# Patient Record
Sex: Female | Born: 1965 | Race: Black or African American | Hispanic: No | Marital: Married | Smoking: Never smoker
Health system: Southern US, Community
[De-identification: ages and names within clinical notes are randomized; demographics above are authoritative.]

## PROBLEM LIST (undated history)

## (undated) DIAGNOSIS — F419 Anxiety disorder, unspecified: Secondary | ICD-10-CM

## (undated) DIAGNOSIS — I10 Essential (primary) hypertension: Secondary | ICD-10-CM

## (undated) DIAGNOSIS — E119 Type 2 diabetes mellitus without complications: Secondary | ICD-10-CM

## (undated) DIAGNOSIS — E785 Hyperlipidemia, unspecified: Secondary | ICD-10-CM

## (undated) DIAGNOSIS — G35 Multiple sclerosis: Secondary | ICD-10-CM

---

## 2016-09-13 ENCOUNTER — Observation Stay (HOSPITAL_COMMUNITY)
Admission: EM | Admit: 2016-09-13 | Discharge: 2016-09-14 | Disposition: A | Discharge status: Home / Self Care | Payer: Medicare HMO | Attending: Infectious Diseases | Admitting: Infectious Diseases

## 2016-09-13 ENCOUNTER — Encounter (HOSPITAL_COMMUNITY): Payer: Self-pay | Admitting: Radiology

## 2016-09-13 ENCOUNTER — Emergency Department (HOSPITAL_COMMUNITY): Payer: Medicare HMO

## 2016-09-13 DIAGNOSIS — R739 Hyperglycemia, unspecified: Secondary | ICD-10-CM

## 2016-09-13 DIAGNOSIS — E785 Hyperlipidemia, unspecified: Secondary | ICD-10-CM | POA: Insufficient documentation

## 2016-09-13 DIAGNOSIS — G43809 Other migraine, not intractable, without status migrainosus: Secondary | ICD-10-CM | POA: Diagnosis not present

## 2016-09-13 DIAGNOSIS — Z823 Family history of stroke: Secondary | ICD-10-CM | POA: Diagnosis not present

## 2016-09-13 DIAGNOSIS — Z8249 Family history of ischemic heart disease and other diseases of the circulatory system: Secondary | ICD-10-CM | POA: Diagnosis not present

## 2016-09-13 DIAGNOSIS — F419 Anxiety disorder, unspecified: Secondary | ICD-10-CM

## 2016-09-13 DIAGNOSIS — E119 Type 2 diabetes mellitus without complications: Secondary | ICD-10-CM | POA: Insufficient documentation

## 2016-09-13 DIAGNOSIS — Z6835 Body mass index (BMI) 35.0-35.9, adult: Secondary | ICD-10-CM | POA: Insufficient documentation

## 2016-09-13 DIAGNOSIS — I639 Cerebral infarction, unspecified: Secondary | ICD-10-CM | POA: Diagnosis present

## 2016-09-13 DIAGNOSIS — R202 Paresthesia of skin: Secondary | ICD-10-CM

## 2016-09-13 DIAGNOSIS — G459 Transient cerebral ischemic attack, unspecified: Secondary | ICD-10-CM | POA: Diagnosis not present

## 2016-09-13 DIAGNOSIS — R200 Anesthesia of skin: Secondary | ICD-10-CM

## 2016-09-13 DIAGNOSIS — Z794 Long term (current) use of insulin: Secondary | ICD-10-CM | POA: Insufficient documentation

## 2016-09-13 DIAGNOSIS — Z888 Allergy status to other drugs, medicaments and biological substances status: Secondary | ICD-10-CM | POA: Insufficient documentation

## 2016-09-13 DIAGNOSIS — Z7982 Long term (current) use of aspirin: Secondary | ICD-10-CM | POA: Insufficient documentation

## 2016-09-13 DIAGNOSIS — Z833 Family history of diabetes mellitus: Secondary | ICD-10-CM

## 2016-09-13 DIAGNOSIS — I6389 Other cerebral infarction: Secondary | ICD-10-CM

## 2016-09-13 DIAGNOSIS — G43909 Migraine, unspecified, not intractable, without status migrainosus: Secondary | ICD-10-CM | POA: Insufficient documentation

## 2016-09-13 DIAGNOSIS — I10 Essential (primary) hypertension: Secondary | ICD-10-CM | POA: Diagnosis not present

## 2016-09-13 DIAGNOSIS — E1165 Type 2 diabetes mellitus with hyperglycemia: Secondary | ICD-10-CM | POA: Diagnosis not present

## 2016-09-13 DIAGNOSIS — G08 Intracranial and intraspinal phlebitis and thrombophlebitis: Secondary | ICD-10-CM

## 2016-09-13 DIAGNOSIS — R51 Headache: Secondary | ICD-10-CM

## 2016-09-13 DIAGNOSIS — R519 Headache, unspecified: Secondary | ICD-10-CM

## 2016-09-13 DIAGNOSIS — G35 Multiple sclerosis: Secondary | ICD-10-CM | POA: Insufficient documentation

## 2016-09-13 DIAGNOSIS — I471 Supraventricular tachycardia: Secondary | ICD-10-CM | POA: Diagnosis not present

## 2016-09-13 DIAGNOSIS — Z79899 Other long term (current) drug therapy: Secondary | ICD-10-CM

## 2016-09-13 DIAGNOSIS — Z8669 Personal history of other diseases of the nervous system and sense organs: Secondary | ICD-10-CM

## 2016-09-13 DIAGNOSIS — E669 Obesity, unspecified: Secondary | ICD-10-CM | POA: Diagnosis not present

## 2016-09-13 HISTORY — DX: Anxiety disorder, unspecified: F41.9

## 2016-09-13 HISTORY — DX: Essential (primary) hypertension: I10

## 2016-09-13 HISTORY — DX: Multiple sclerosis: G35

## 2016-09-13 HISTORY — DX: Hyperlipidemia, unspecified: E78.5

## 2016-09-13 HISTORY — DX: Type 2 diabetes mellitus without complications: E11.9

## 2016-09-13 LAB — PROTIME-INR
INR: 0.99
PROTHROMBIN TIME: 13.1 s (ref 11.4–15.2)

## 2016-09-13 LAB — COMPREHENSIVE METABOLIC PANEL
ALBUMIN: 3.4 g/dL — AB (ref 3.5–5.0)
ALT: 20 U/L (ref 14–54)
ANION GAP: 11 (ref 5–15)
AST: 17 U/L (ref 15–41)
Alkaline Phosphatase: 149 U/L — ABNORMAL HIGH (ref 38–126)
BUN: 5 mg/dL — AB (ref 6–20)
CHLORIDE: 103 mmol/L (ref 101–111)
CO2: 25 mmol/L (ref 22–32)
Calcium: 9.8 mg/dL (ref 8.9–10.3)
Creatinine, Ser: 0.66 mg/dL (ref 0.44–1.00)
GFR calc Af Amer: >60 mL/min (ref >60)
GFR calc non Af Amer: >60 mL/min (ref >60)
GLUCOSE: 339 mg/dL — AB (ref 65–99)
POTASSIUM: 3.5 mmol/L (ref 3.5–5.1)
SODIUM: 139 mmol/L (ref 135–145)
TOTAL PROTEIN: 6.5 g/dL (ref 6.5–8.1)
Total Bilirubin: 0.8 mg/dL (ref 0.3–1.2)

## 2016-09-13 LAB — I-STAT CHEM 8, ED
BUN: 5 mg/dL — AB (ref 6–20)
CREATININE: 0.6 mg/dL (ref 0.44–1.00)
Calcium, Ion: 1.21 mmol/L (ref 1.15–1.40)
Chloride: 101 mmol/L (ref 101–111)
Glucose, Bld: 327 mg/dL — ABNORMAL HIGH (ref 65–99)
HEMATOCRIT: 41 % (ref 36.0–46.0)
HEMOGLOBIN: 13.9 g/dL (ref 12.0–15.0)
POTASSIUM: 3.5 mmol/L (ref 3.5–5.1)
SODIUM: 140 mmol/L (ref 135–145)
TCO2: 27 mmol/L (ref 0–100)

## 2016-09-13 LAB — DIFFERENTIAL
BASOS ABS: 0 10*3/uL (ref 0.0–0.1)
BASOS PCT: 0 %
EOS ABS: 0.1 10*3/uL (ref 0.0–0.7)
EOS PCT: 1 %
LYMPHS ABS: 3.3 10*3/uL (ref 0.7–4.0)
Lymphocytes Relative: 28 %
Monocytes Absolute: 0.6 10*3/uL (ref 0.1–1.0)
Monocytes Relative: 5 %
NEUTROS PCT: 66 %
Neutro Abs: 7.6 10*3/uL (ref 1.7–7.7)

## 2016-09-13 LAB — CBC
HCT: 39.8 % (ref 36.0–46.0)
HEMOGLOBIN: 13.4 g/dL (ref 12.0–15.0)
MCH: 27.9 pg (ref 26.0–34.0)
MCHC: 33.7 g/dL (ref 30.0–36.0)
MCV: 82.7 fL (ref 78.0–100.0)
PLATELETS: 211 10*3/uL (ref 150–400)
RBC: 4.81 MIL/uL (ref 3.87–5.11)
RDW: 12.2 % (ref 11.5–15.5)
WBC: 11.6 10*3/uL — ABNORMAL HIGH (ref 4.0–10.5)

## 2016-09-13 LAB — I-STAT TROPONIN, ED: Troponin i, poc: 0.01 ng/mL (ref 0.00–0.08)

## 2016-09-13 LAB — APTT: APTT: 27 s (ref 24–36)

## 2016-09-13 MED ORDER — ONDANSETRON HCL 4 MG/2ML IJ SOLN
4.0000 mg | Freq: Once | INTRAMUSCULAR | Status: AC
  Administered 2016-09-13: 4 mg via INTRAVENOUS
  Filled 2016-09-13: qty 2

## 2016-09-13 MED ORDER — METOCLOPRAMIDE HCL 5 MG/ML IJ SOLN
10.0000 mg | Freq: Once | INTRAMUSCULAR | Status: AC
  Administered 2016-09-13: 10 mg via INTRAVENOUS
  Filled 2016-09-13: qty 2

## 2016-09-13 MED ORDER — HYDROMORPHONE HCL 2 MG/ML IJ SOLN
2.0000 mg | Freq: Once | INTRAMUSCULAR | Status: AC
  Administered 2016-09-13: 2 mg via INTRAVENOUS
  Filled 2016-09-13: qty 1

## 2016-09-13 MED ORDER — ATORVASTATIN CALCIUM 80 MG PO TABS
80.0000 mg | ORAL_TABLET | Freq: Every day | ORAL | Status: DC
  Administered 2016-09-14 (×2): 80 mg via ORAL
  Filled 2016-09-13 (×2): qty 1

## 2016-09-13 MED ORDER — MORPHINE SULFATE (PF) 4 MG/ML IV SOLN
2.0000 mg | Freq: Once | INTRAVENOUS | Status: AC
  Administered 2016-09-13: 2 mg via INTRAVENOUS
  Filled 2016-09-13: qty 1
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
## 2016-09-13 NOTE — ED Notes (Signed)
Patient transported to MRI 
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
## 2016-09-13 NOTE — ED Provider Notes (Addendum)
[facility]-EMERGENCY DEPT Provider Note   CSN: [id number] Arrival date & time: 09/13/16  1936     History   Chief Complaint Chief Complaint  Patient presents with  . Code Stroke    HPI Alisha Morton is a 50 y.o. female.  Patient c/o right facial, and ?RUE numbness, onset this evening. States vision also seemed diffusely blurry. No visual field cut or amaurosis. No eye pain. Symptoms persistent since onset at approximately 630 PM tonight. Patient with hx MS, denies any recent new symptoms or exacerbation. Patient also notes dull headache, frontal/diffuse. No neck stiffness or rigidity. Denies hx migraines. Patient denies any change in speech. No weakness or loss of normal functional ability.    The history is provided by the patient and the EMS personnel.    Past Medical History:  Diagnosis Date  . Diabetes mellitus without complication (HCC)   . Hypertension   . Multiple sclerosis (HCC)     There are no active problems to display for this patient.   No past surgical history on file.  OB History    No data available       Home Medications    Prior to Admission medications   Not on File    Family History No family history on file.  Social History Social History  Substance Use Topics  . Smoking status: Not on file  . Smokeless tobacco: Not on file  . Alcohol use Not on file     Allergies   Ambien [zolpidem]; Zinc; and Zoloft [sertraline hcl]   Review of Systems Review of Systems  Constitutional: Negative for fever.  HENT: Negative for sinus pain and sore throat.   Eyes: Negative for pain and redness.  Respiratory: Negative for shortness of breath.   Cardiovascular: Negative for chest pain.  Gastrointestinal: Negative for abdominal pain, diarrhea and vomiting.  Genitourinary: Negative for flank pain.  Musculoskeletal: Negative for back pain and neck pain.  Skin: Negative for rash.  Neurological: Negative for headaches.  Hematological: Does not  bruise/bleed easily.  Psychiatric/Behavioral: Negative for confusion.     Physical Exam Updated Vital Signs BP 179/97 (BP Location: Right Arm)   Pulse 91   Temp 98.1 F (36.7 C) (Oral)   Resp 24   Ht 5\' 8"  (1.727 m)   Wt 105.2 kg   SpO2 100%   BMI 35.28 kg/m   Physical Exam  Constitutional: She appears well-developed and well-nourished. No distress.  HENT:  Head: Atraumatic.  Eyes: Conjunctivae are normal. No scleral icterus.  Neck: Neck supple. No tracheal deviation present.  No bruits  Cardiovascular: Normal rate, regular rhythm, normal heart sounds and intact distal pulses.   Pulmonary/Chest: Effort normal and breath sounds normal. No respiratory distress.  Abdominal: Normal appearance. She exhibits no distension. There is no tenderness.  Musculoskeletal: She exhibits no edema.  Neurological: She is alert. No cranial nerve deficit.  Motor intact bil, stre 5/5. sens grossly intact  Skin: Skin is warm and dry. No rash noted.  Psychiatric: She has a normal mood and affect.  Nursing note and vitals reviewed.    ED Treatments / Results  Labs (all labs ordered are listed, but only abnormal results are displayed) Results for orders placed or performed during the hospital encounter of 09/13/16  Protime-INR  Result Value Ref Range   Prothrombin Time 13.1 11.4 - 15.2 seconds   INR 0.99   APTT  Result Value Ref Range   aPTT 27 24 - 36 seconds  CBC  Result Value Ref Range   WBC 11.6 (H) 4.0 - 10.5 K/uL   RBC 4.81 3.87 - 5.11 MIL/uL   Hemoglobin 13.4 12.0 - 15.0 g/dL   HCT 16.139.8 09.636.0 - 04.546.0 %   MCV 82.7 78.0 - 100.0 fL   MCH 27.9 26.0 - 34.0 pg   MCHC 33.7 30.0 - 36.0 g/dL   RDW 40.912.2 81.111.5 - 91.415.5 %   Platelets 211 150 - 400 K/uL  Differential  Result Value Ref Range   Neutrophils Relative % 66 %   Neutro Abs 7.6 1.7 - 7.7 K/uL   Lymphocytes Relative 28 %   Lymphs Abs 3.3 0.7 - 4.0 K/uL   Monocytes Relative 5 %   Monocytes Absolute 0.6 0.1 - 1.0 K/uL    Eosinophils Relative 1 %   Eosinophils Absolute 0.1 0.0 - 0.7 K/uL   Basophils Relative 0 %   Basophils Absolute 0.0 0.0 - 0.1 K/uL  Comprehensive metabolic panel  Result Value Ref Range   Sodium 139 135 - 145 mmol/L   Potassium 3.5 3.5 - 5.1 mmol/L   Chloride 103 101 - 111 mmol/L   CO2 25 22 - 32 mmol/L   Glucose, Bld 339 (H) 65 - 99 mg/dL   BUN 5 (L) 6 - 20 mg/dL   Creatinine, Ser 7.820.66 0.44 - 1.00 mg/dL   Calcium 9.8 8.9 - 95.610.3 mg/dL   Total Protein 6.5 6.5 - 8.1 g/dL   Albumin 3.4 (L) 3.5 - 5.0 g/dL   AST 17 15 - 41 U/L   ALT 20 14 - 54 U/L   Alkaline Phosphatase 149 (H) 38 - 126 U/L   Total Bilirubin 0.8 0.3 - 1.2 mg/dL   GFR calc non Af Amer >60 >60 mL/min   GFR calc Af Amer >60 >60 mL/min   Anion gap 11 5 - 15  I-stat troponin, ED  Result Value Ref Range   Troponin i, poc 0.01 0.00 - 0.08 ng/mL   Comment 3          I-Stat Chem 8, ED  Result Value Ref Range   Sodium 140 135 - 145 mmol/L   Potassium 3.5 3.5 - 5.1 mmol/L   Chloride 101 101 - 111 mmol/L   BUN 5 (L) 6 - 20 mg/dL   Creatinine, Ser 2.130.60 0.44 - 1.00 mg/dL   Glucose, Bld 086327 (H) 65 - 99 mg/dL   Calcium, Ion 5.781.21 4.691.15 - 1.40 mmol/L   TCO2 27 0 - 100 mmol/L   Hemoglobin 13.9 12.0 - 15.0 g/dL   HCT 62.941.0 52.836.0 - 41.346.0 %   Mr Brain Wo Contrast  Result Date: 09/13/2016 CLINICAL DATA:  Stroke symptoms. Photophobia right-sided numbness. Blurred vision. History of MS. EXAM: MRI HEAD WITHOUT CONTRAST TECHNIQUE: Multiplanar, multiecho pulse sequences of the brain and surrounding structures were obtained without intravenous contrast. COMPARISON:  Head CT from earlier tonight FINDINGS: Brain: No acute infarction, hemorrhage, hydrocephalus, extra-axial collection or mass lesion. Moderate for age patchy FLAIR hyperintensity in the cerebral white matter, with thin confluent rim around the lateral ventricles and patchy in the deep and juxta cortical white matter. No infratentorial involvement is noted. No black holes or brain  atrophy. No restricted diffusion to suggest active demyelination. Expanded sella which is predominately CSF intensity, with internal flow void. Findings consistent with partially empty sella. Vascular: Loss of normal flow void within the right vein of Labbe. Normal arterial flow voids. Normal dural venous sinus appearance. Skull and upper cervical spine: Negative Sinuses/Orbits:  Negative IMPRESSION: 1. Abnormal signal in the right vein of Labbe, recommend enhanced MRV or CTV. 2. No acute infarct. 3. Moderate white matter disease with nonspecific pattern in this patient with history of multiple sclerosis and vascular risk factors. Electronically Signed   By: Marnee Spring M.D.   On: 09/13/2016 21:08   Ct Head Code Stroke W/o Cm  Result Date: 09/13/2016 CLINICAL DATA:  Code stroke. Sudden onset right-sided headache. Right facial numbness and blurry vision. EXAM: CT HEAD WITHOUT CONTRAST TECHNIQUE: Contiguous axial images were obtained from the base of the skull through the vertex without intravenous contrast. COMPARISON:  None. FINDINGS: Brain: No evidence of acute infarction, hemorrhage, hydrocephalus, extra-axial collection or mass lesion/mass effect. Mild moderate for age patchy low-density in the cerebral white matter, nonspecific but usually from chronic microvascular disease if vascular risk factors. Demyelinating process is also considered given patient's age and history of blurry vision. Vascular: No hyperdense vessel. Skull: Expanded low-density sella consistent with partially empty sella. There is no upward displacement of the chiasm to suggest an arachnoid cyst. Sinuses/Orbits: Negative Other: These results were called by telephone at the time of interpretation on 09/13/2016 at 7:52 pm to Dr. Roseanne Reno, who verbally acknowledged these results. ASPECTS [facility] Stroke Program Early CT Score) - Ganglionic level infarction (caudate, lentiform nuclei, internal capsule, insula, M1-M3 cortex): 7 -  Supraganglionic infarction (M4-M6 cortex): 3 Total score (0-10 with 10 being normal): 10 IMPRESSION: 1. No acute finding.ASPECTS is 10. 2. Nonspecific white matter disease as described above. Electronically Signed   By: Marnee Spring M.D.   On: 09/13/2016 19:57    EKG  EKG Interpretation  Date/Time:  Sunday September 13 2016 19:59:53 EST Ventricular Rate:  95 PR Interval:    QRS Duration: 87 QT Interval:  330 QTC Calculation: 415 R Axis:   65 Text Interpretation:  Sinus rhythm Nonspecific T wave abnormality No previous tracing Confirmed by Denton Lank  MD, Caryn Bee ([id number]) on 09/13/2016 8:10:25 PM       Radiology Ct Head Code Stroke W/o Cm  Result Date: 09/13/2016 CLINICAL DATA:  Code stroke. Sudden onset right-sided headache. Right facial numbness and blurry vision. EXAM: CT HEAD WITHOUT CONTRAST TECHNIQUE: Contiguous axial images were obtained from the base of the skull through the vertex without intravenous contrast. COMPARISON:  None. FINDINGS: Brain: No evidence of acute infarction, hemorrhage, hydrocephalus, extra-axial collection or mass lesion/mass effect. Mild moderate for age patchy low-density in the cerebral white matter, nonspecific but usually from chronic microvascular disease if vascular risk factors. Demyelinating process is also considered given patient's age and history of blurry vision. Vascular: No hyperdense vessel. Skull: Expanded low-density sella consistent with partially empty sella. There is no upward displacement of the chiasm to suggest an arachnoid cyst. Sinuses/Orbits: Negative Other: These results were called by telephone at the time of interpretation on 09/13/2016 at 7:52 pm to Dr. Roseanne Reno, who verbally acknowledged these results. ASPECTS [facility] Stroke Program Early CT Score) - Ganglionic level infarction (caudate, lentiform nuclei, internal capsule, insula, M1-M3 cortex): 7 - Supraganglionic infarction (M4-M6 cortex): 3 Total score (0-10 with 10 being normal): 10  IMPRESSION: 1. No acute finding.ASPECTS is 10. 2. Nonspecific white matter disease as described above. Electronically Signed   By: Marnee Spring M.D.   On: 09/13/2016 19:57    Procedures Procedures (including critical care time)  Medications Ordered in ED Medications  HYDROmorphone (DILAUDID) injection 2 mg (2 mg Intravenous Given 09/13/16 2008)  ondansetron (ZOFRAN) injection 4 mg (4 mg Intravenous Given 09/13/16  2008)     Initial Impression / Assessment and Plan / ED Course  I have reviewed the triage vital signs and the nursing notes.  Pertinent labs & imaging results that were available during my care of the patient were reviewed by me and considered in my medical decision making (see chart for details).  Clinical Course     Iv ns. Labs. Continuous pulse ox and monitor.  Neurology, Dr Roseanne Reno, evaluated in ED and recommends admit to medical service and mri.  Recheck pt, numbness improving but not completely resolved.  reglan for nausea/headache.   Medical service consulted for admission.     Final Clinical Impressions(s) / ED Diagnoses   Final diagnoses:  None    New Prescriptions New Prescriptions   No medications on file     Cathren Laine, MD 09/13/16 2131    Cathren Laine, MD 10/08/16 [phone]

## 2016-09-13 NOTE — ED Notes (Signed)
Pt returned from MRI and placed back on monitor.  

## 2016-09-13 NOTE — ED Notes (Signed)
Pt remains in MRI 

## 2016-09-13 NOTE — Consult Note (Signed)
Admission H&P    Chief Complaint: Acute onset of headache and right-sided numbness.  HPI: Alisha Morton is an 50 y.o. female history diabetes mellitus, hypertension and multiple sclerosis, as well as SVT, brought to the ED in code stroke status following acute onset of severe right posterior headache and numbness involving right side of her face arm and leg at 6:40 PM while driving. She has no previous history of headache of this nature and no history of stroke nor TIA. Speech remained unchanged. CT scan of her head showed nonspecific white matter changes but no acute intracranial abnormality. Numbness on the right side has persisted. NIH stroke score was 1. MRI of the brain is pending.  LSN: 6:40 PM on 09/13/2016 tPA Given: No: Minimal deficits mRankin:  Past Medical History:  Diagnosis Date  . Diabetes mellitus without complication (HCC)   . Hypertension   . Multiple sclerosis (HCC)     No past surgical history on file.  No family history on file. Social History:  has no tobacco, alcohol, and drug history on file.  Allergies:  Allergies  Allergen Reactions  . Ambien [Zolpidem]     Confusion   . Zinc     Headache   . Zoloft [Sertraline Hcl]     Confusion     Medications: Outpatient medications were reviewed by me.  ROS: History obtained from the patient  General ROS: negative for - chills, fatigue, fever, night sweats, weight gain or weight loss Psychological ROS: negative for - behavioral disorder, hallucinations, memory difficulties, mood swings or suicidal ideation Ophthalmic ROS: negative for - blurry vision, double vision, eye pain or loss of vision ENT ROS: negative for - epistaxis, nasal discharge, oral lesions, sore throat, tinnitus or vertigo Allergy and Immunology ROS: negative for - hives or itchy/watery eyes Hematological and Lymphatic ROS: negative for - bleeding problems, bruising or swollen lymph nodes Endocrine ROS: negative for - galactorrhea, hair  pattern changes, polydipsia/polyuria or temperature intolerance Respiratory ROS: negative for - cough, hemoptysis, shortness of breath or wheezing Cardiovascular ROS: negative for - chest pain, dyspnea on exertion, edema or irregular heartbeat Gastrointestinal ROS: negative for - abdominal pain, diarrhea, hematemesis, nausea/vomiting or stool incontinence Genito-Urinary ROS: negative for - dysuria, hematuria, incontinence or urinary frequency/urgency Musculoskeletal ROS: negative for - joint swelling or muscular weakness Neurological ROS: as noted in HPI Dermatological ROS: negative for rash and skin lesion changes  Physical Examination: Blood pressure 179/97, pulse 91, temperature 98.1 F (36.7 C), temperature source Oral, resp. rate 24, height 5\' 8"  (1.727 m), weight 107.3 kg (236 lb 8.9 oz), SpO2 100 %.  HEENT-  Normocephalic, no lesions, without obvious abnormality.  Normal external eye and conjunctiva.  Normal TM's bilaterally.  Normal auditory canals and external ears. Normal external nose, mucus membranes and septum.  Normal pharynx. Neck supple with no masses, nodes, nodules or enlargement. Cardiovascular - regular rate and rhythm and systolic murmur: systolic ejection 2/6, crescendo at 2nd right intercostal space Lungs - chest clear, no wheezing, rales, normal symmetric air entry Abdomen - soft, non-tender; bowel sounds normal; no masses,  no organomegaly Extremities - no joint deformities, effusion, or inflammation and no edema  Neurologic Examination: Mental Status: Alert, oriented, thought content appropriate, complaining of moderately severe headache.  Speech fluent without evidence of aphasia. Able to follow commands without difficulty. Cranial Nerves: II-Visual fields were normal. III/IV/VI-Pupils were equal and reacted normally to light. Extraocular movements were full and conjugate.    V/VII-no facial numbness and no facial  weakness. VIII-normal. X-normal speech. XI:  trapezius strength/neck flexion strength normal bilaterally XII-midline tongue extension with normal strength. Motor: 5/5 bilaterally with normal tone and bulk Sensory: Normal throughout. Deep Tendon Reflexes: Trace to 1+ and symmetric in upper extremities and absent in lower extremities. Plantars: Flexor bilaterally Cerebellar: Normal finger-to-nose testing. Carotid auscultation: Normal  Results for orders placed or performed during the hospital encounter of 09/13/16 (from the past 48 hour(s))  CBC     Status: Abnormal   Collection Time: 09/13/16  7:45 PM  Result Value Ref Range   WBC 11.6 (H) 4.0 - 10.5 K/uL   RBC 4.81 3.87 - 5.11 MIL/uL   Hemoglobin 13.4 12.0 - 15.0 g/dL   HCT 16.1 09.6 - 04.5 %   MCV 82.7 78.0 - 100.0 fL   MCH 27.9 26.0 - 34.0 pg   MCHC 33.7 30.0 - 36.0 g/dL   RDW 40.9 81.1 - 91.4 %   Platelets 211 150 - 400 K/uL  Differential     Status: None   Collection Time: 09/13/16  7:45 PM  Result Value Ref Range   Neutrophils Relative % 66 %   Neutro Abs 7.6 1.7 - 7.7 K/uL   Lymphocytes Relative 28 %   Lymphs Abs 3.3 0.7 - 4.0 K/uL   Monocytes Relative 5 %   Monocytes Absolute 0.6 0.1 - 1.0 K/uL   Eosinophils Relative 1 %   Eosinophils Absolute 0.1 0.0 - 0.7 K/uL   Basophils Relative 0 %   Basophils Absolute 0.0 0.0 - 0.1 K/uL  I-stat troponin, ED     Status: None   Collection Time: 09/13/16  7:47 PM  Result Value Ref Range   Troponin i, poc 0.01 0.00 - 0.08 ng/mL   Comment 3            Comment: Due to the release kinetics of cTnI, a negative result within the first hours of the onset of symptoms does not rule out myocardial infarction with certainty. If myocardial infarction is still suspected, repeat the test at appropriate intervals.   I-Stat Chem 8, ED     Status: Abnormal   Collection Time: 09/13/16  7:48 PM  Result Value Ref Range   Sodium 140 135 - 145 mmol/L   Potassium 3.5 3.5 - 5.1 mmol/L   Chloride 101 101 - 111 mmol/L   BUN 5 (L) 6 - 20  mg/dL   Creatinine, Ser 7.82 0.44 - 1.00 mg/dL   Glucose, Bld 956 (H) 65 - 99 mg/dL   Calcium, Ion 2.13 0.86 - 1.40 mmol/L   TCO2 27 0 - 100 mmol/L   Hemoglobin 13.9 12.0 - 15.0 g/dL   HCT 57.8 46.9 - 62.9 %   Ct Head Code Stroke W/o Cm  Result Date: 09/13/2016 CLINICAL DATA:  Code stroke. Sudden onset right-sided headache. Right facial numbness and blurry vision. EXAM: CT HEAD WITHOUT CONTRAST TECHNIQUE: Contiguous axial images were obtained from the base of the skull through the vertex without intravenous contrast. COMPARISON:  None. FINDINGS: Brain: No evidence of acute infarction, hemorrhage, hydrocephalus, extra-axial collection or mass lesion/mass effect. Mild moderate for age patchy low-density in the cerebral white matter, nonspecific but usually from chronic microvascular disease if vascular risk factors. Demyelinating process is also considered given patient's age and history of blurry vision. Vascular: No hyperdense vessel. Skull: Expanded low-density sella consistent with partially empty sella. There is no upward displacement of the chiasm to suggest an arachnoid cyst. Sinuses/Orbits: Negative Other: These results were called by  telephone at the time of interpretation on 09/13/2016 at 7:52 pm to Dr. Roseanne Reno, who verbally acknowledged these results. ASPECTS [facility] Stroke Program Early CT Score) - Ganglionic level infarction (caudate, lentiform nuclei, internal capsule, insula, M1-M3 cortex): 7 - Supraganglionic infarction (M4-M6 cortex): 3 Total score (0-10 with 10 being normal): 10 IMPRESSION: 1. No acute finding.ASPECTS is 10. 2. Nonspecific white matter disease as described above. Electronically Signed   By: Marnee Spring M.D.   On: 09/13/2016 19:57    Assessment: 50 y.o. female with multiple risk factors for stroke presenting with possible TIA or subcortical left small vessel ischemic infarction, in addition to hypertension and moderately severe headache. CT showed no evidence of an  acute hemorrhage.  Stroke Risk Factors - diabetes mellitus, family history and hypertension  Plan: 1. HgbA1c, fasting lipid panel 2. MRA  of the brain without contrast 3. PT consult, OT consult, Speech consult 4. Echocardiogram 5. Carotid dopplers 6. Prophylactic therapy-Antiplatelet med: Aspirin  7. Risk factor modification 8. Telemetry monitoring  C.R. Roseanne Reno, MD [facility] [phone]  09/13/2016, 8:12 PM

## 2016-09-14 ENCOUNTER — Encounter (HOSPITAL_COMMUNITY): Payer: Self-pay | Admitting: General Practice

## 2016-09-14 ENCOUNTER — Observation Stay (HOSPITAL_COMMUNITY): Payer: Medicare HMO

## 2016-09-14 ENCOUNTER — Observation Stay (HOSPITAL_BASED_OUTPATIENT_CLINIC_OR_DEPARTMENT_OTHER): Payer: Medicare HMO

## 2016-09-14 DIAGNOSIS — F419 Anxiety disorder, unspecified: Secondary | ICD-10-CM | POA: Diagnosis not present

## 2016-09-14 DIAGNOSIS — I10 Essential (primary) hypertension: Secondary | ICD-10-CM

## 2016-09-14 DIAGNOSIS — I6789 Other cerebrovascular disease: Secondary | ICD-10-CM | POA: Diagnosis not present

## 2016-09-14 DIAGNOSIS — E118 Type 2 diabetes mellitus with unspecified complications: Secondary | ICD-10-CM

## 2016-09-14 DIAGNOSIS — Z8669 Personal history of other diseases of the nervous system and sense organs: Secondary | ICD-10-CM

## 2016-09-14 DIAGNOSIS — Z794 Long term (current) use of insulin: Secondary | ICD-10-CM

## 2016-09-14 DIAGNOSIS — R739 Hyperglycemia, unspecified: Secondary | ICD-10-CM

## 2016-09-14 DIAGNOSIS — E1165 Type 2 diabetes mellitus with hyperglycemia: Secondary | ICD-10-CM | POA: Diagnosis not present

## 2016-09-14 DIAGNOSIS — R930 Abnormal findings on diagnostic imaging of skull and head, not elsewhere classified: Secondary | ICD-10-CM | POA: Diagnosis not present

## 2016-09-14 DIAGNOSIS — I639 Cerebral infarction, unspecified: Secondary | ICD-10-CM | POA: Diagnosis present

## 2016-09-14 DIAGNOSIS — R51 Headache: Secondary | ICD-10-CM

## 2016-09-14 DIAGNOSIS — R200 Anesthesia of skin: Secondary | ICD-10-CM

## 2016-09-14 DIAGNOSIS — R29818 Other symptoms and signs involving the nervous system: Secondary | ICD-10-CM | POA: Diagnosis not present

## 2016-09-14 DIAGNOSIS — R519 Headache, unspecified: Secondary | ICD-10-CM

## 2016-09-14 DIAGNOSIS — E785 Hyperlipidemia, unspecified: Secondary | ICD-10-CM

## 2016-09-14 LAB — GLUCOSE, CAPILLARY
GLUCOSE-CAPILLARY: 371 mg/dL — AB (ref 65–99)
Glucose-Capillary: 182 mg/dL — ABNORMAL HIGH (ref 65–99)
Glucose-Capillary: 168 mg/dL — ABNORMAL HIGH (ref 65–99)
Glucose-Capillary: 266 mg/dL — ABNORMAL HIGH (ref 65–99)
Glucose-Capillary: 377 mg/dL — ABNORMAL HIGH (ref 65–99)

## 2016-09-14 LAB — CBC
HEMATOCRIT: 37.2 % (ref 36.0–46.0)
Hemoglobin: 12.2 g/dL (ref 12.0–15.0)
MCH: 27.6 pg (ref 26.0–34.0)
MCHC: 32.8 g/dL (ref 30.0–36.0)
MCV: 84.2 fL (ref 78.0–100.0)
Platelets: 214 10*3/uL (ref 150–400)
RBC: 4.42 MIL/uL (ref 3.87–5.11)
RDW: 12.5 % (ref 11.5–15.5)
WBC: 11.8 10*3/uL — AB (ref 4.0–10.5)

## 2016-09-14 LAB — RAPID URINE DRUG SCREEN, HOSP PERFORMED
AMPHETAMINES: NOT DETECTED
Barbiturates: NOT DETECTED
Benzodiazepines: NOT DETECTED
Cocaine: NOT DETECTED
OPIATES: POSITIVE — AB
Tetrahydrocannabinol: NOT DETECTED

## 2016-09-14 LAB — LIPID PANEL
Cholesterol: 241 mg/dL — ABNORMAL HIGH (ref 0–200)
HDL: 40 mg/dL — ABNORMAL LOW (ref >40)
LDL CALC: 156 mg/dL — AB (ref 0–99)
TRIGLYCERIDES: 227 mg/dL — AB (ref <150)
Total CHOL/HDL Ratio: 6 RATIO
VLDL: 45 mg/dL — AB (ref 0–40)

## 2016-09-14 LAB — ETHANOL

## 2016-09-14 LAB — COMPREHENSIVE METABOLIC PANEL
ALT: 18 U/L (ref 14–54)
AST: 18 U/L (ref 15–41)
Albumin: 3.2 g/dL — ABNORMAL LOW (ref 3.5–5.0)
Alkaline Phosphatase: 133 U/L — ABNORMAL HIGH (ref 38–126)
Anion gap: 9 (ref 5–15)
BILIRUBIN TOTAL: 0.8 mg/dL (ref 0.3–1.2)
BUN: 6 mg/dL (ref 6–20)
CO2: 25 mmol/L (ref 22–32)
CREATININE: 0.64 mg/dL (ref 0.44–1.00)
Calcium: 9.5 mg/dL (ref 8.9–10.3)
Chloride: 103 mmol/L (ref 101–111)
GFR calc Af Amer: >60 mL/min (ref >60)
Glucose, Bld: 401 mg/dL — ABNORMAL HIGH (ref 65–99)
Potassium: 3.9 mmol/L (ref 3.5–5.1)
Sodium: 137 mmol/L (ref 135–145)
TOTAL PROTEIN: 6.3 g/dL — AB (ref 6.5–8.1)

## 2016-09-14 LAB — ECHOCARDIOGRAM COMPLETE
HEIGHTINCHES: 68 in
Weight: 3777.6 oz

## 2016-09-14 MED ORDER — SENNOSIDES-DOCUSATE SODIUM 8.6-50 MG PO TABS: 1.0000 | ORAL_TABLET | Freq: Every evening | ORAL | Status: DC | PRN

## 2016-09-14 MED ORDER — AMITRIPTYLINE HCL 25 MG PO TABS
50.0000 mg | ORAL_TABLET | Freq: Every day | ORAL | Status: DC
  Administered 2016-09-14 (×2): 50 mg via ORAL
  Filled 2016-09-14 (×2): qty 2

## 2016-09-14 MED ORDER — ENOXAPARIN SODIUM 40 MG/0.4ML ~~LOC~~ SOLN
40.0000 mg | SUBCUTANEOUS | Status: DC
  Administered 2016-09-14: 40 mg via SUBCUTANEOUS
  Filled 2016-09-14: qty 0.4

## 2016-09-14 MED ORDER — DIPHENHYDRAMINE HCL 50 MG/ML IJ SOLN
12.5000 mg | Freq: Once | INTRAMUSCULAR | Status: AC
  Administered 2016-09-14: 12.5 mg via INTRAVENOUS
  Filled 2016-09-14: qty 1

## 2016-09-14 MED ORDER — LISINOPRIL 5 MG PO TABS
5.0000 mg | ORAL_TABLET | Freq: Every day | ORAL | Status: DC
  Administered 2016-09-14: 5 mg via ORAL
  Filled 2016-09-14: qty 1

## 2016-09-14 MED ORDER — KETOROLAC TROMETHAMINE 30 MG/ML IJ SOLN
30.0000 mg | Freq: Once | INTRAMUSCULAR | Status: AC
  Administered 2016-09-14: 30 mg via INTRAVENOUS
  Filled 2016-09-14: qty 1

## 2016-09-14 MED ORDER — STROKE: EARLY STAGES OF RECOVERY BOOK
Freq: Once | Status: AC
  Administered 2016-09-14: 1

## 2016-09-14 MED ORDER — 3-IN-1 BEDSIDE TOILET MISC: 1.0000 [IU] | Freq: Once | 0 refills | Status: AC

## 2016-09-14 MED ORDER — CITALOPRAM HYDROBROMIDE 10 MG PO TABS
20.0000 mg | ORAL_TABLET | Freq: Every day | ORAL | Status: DC
  Administered 2016-09-14: 20 mg via ORAL
  Filled 2016-09-14: qty 2

## 2016-09-14 MED ORDER — PROCHLORPERAZINE EDISYLATE 5 MG/ML IJ SOLN
10.0000 mg | Freq: Once | INTRAMUSCULAR | Status: AC
  Administered 2016-09-14: 10 mg via INTRAVENOUS
  Filled 2016-09-14: qty 2

## 2016-09-14 MED ORDER — BUTALBITAL-APAP-CAFFEINE 50-325-40 MG PO TABS
1.0000 | ORAL_TABLET | Freq: Three times a day (TID) | ORAL | Status: DC | PRN
  Administered 2016-09-14: 1 via ORAL
  Filled 2016-09-14: qty 1

## 2016-09-14 MED ORDER — SODIUM CHLORIDE 0.9 % IV SOLN
INTRAVENOUS | Status: DC
  Administered 2016-09-14: 1000 mL via INTRAVENOUS

## 2016-09-14 MED ORDER — ASPIRIN 325 MG PO TABS
325.0000 mg | ORAL_TABLET | Freq: Every day | ORAL | Status: DC
  Administered 2016-09-14: 325 mg via ORAL
  Filled 2016-09-14 (×2): qty 1

## 2016-09-14 MED ORDER — ONDANSETRON HCL 4 MG/2ML IJ SOLN
4.0000 mg | Freq: Four times a day (QID) | INTRAMUSCULAR | Status: DC
  Administered 2016-09-14 (×4): 4 mg via INTRAVENOUS
  Filled 2016-09-14 (×4): qty 2

## 2016-09-14 MED ORDER — ASPIRIN 300 MG RE SUPP: 300.0000 mg | Freq: Every day | RECTAL | Status: DC

## 2016-09-14 MED ORDER — MORPHINE SULFATE (PF) 4 MG/ML IV SOLN
2.0000 mg | INTRAVENOUS | Status: DC | PRN
  Administered 2016-09-14: 2 mg via INTRAVENOUS
  Filled 2016-09-14: qty 1

## 2016-09-14 MED ORDER — PANTOPRAZOLE SODIUM 40 MG PO TBEC
40.0000 mg | DELAYED_RELEASE_TABLET | Freq: Every day | ORAL | Status: DC
  Administered 2016-09-14: 40 mg via ORAL
  Filled 2016-09-14: qty 1

## 2016-09-14 MED ORDER — INSULIN ASPART 100 UNIT/ML ~~LOC~~ SOLN
0.0000 [IU] | Freq: Three times a day (TID) | SUBCUTANEOUS | Status: DC
  Administered 2016-09-14: 9 [IU] via SUBCUTANEOUS
  Administered 2016-09-14 (×2): 2 [IU] via SUBCUTANEOUS

## 2016-09-14 MED ORDER — INSULIN DETEMIR 100 UNIT/ML ~~LOC~~ SOLN
20.0000 [IU] | Freq: Every day | SUBCUTANEOUS | Status: DC
  Administered 2016-09-14 (×2): 20 [IU] via SUBCUTANEOUS
  Filled 2016-09-14 (×2): qty 0.2

## 2016-09-14 MED ORDER — GADOBENATE DIMEGLUMINE 529 MG/ML IV SOLN
20.0000 mL | Freq: Once | INTRAVENOUS | Status: AC | PRN
  Administered 2016-09-14: 20 mL via INTRAVENOUS

## 2016-09-14 MED ORDER — IOPAMIDOL (ISOVUE-370) INJECTION 76%
100.0000 mL | Freq: Once | INTRAVENOUS | Status: AC | PRN
  Administered 2016-09-14: 100 mL via INTRAVENOUS

## 2016-09-14 NOTE — Care Management Obs Status (Signed)
MEDICARE OBSERVATION STATUS NOTIFICATION   Patient Details  Name: Alisha Morton MRN: [id number] Date of Birth: 12/27/1965   Medicare Observation Status Notification Given:  Yes    Kermit Balo, RN 09/14/2016, 1:25 PM

## 2016-09-14 NOTE — Progress Notes (Signed)
Inpatient Diabetes Program Recommendations  AACE/ADA: New Consensus Statement on Inpatient Glycemic Control (2015)  Target Ranges:  Prepandial:   less than 140 mg/dL      Peak postprandial:   less than 180 mg/dL (1-2 hours)      Critically ill patients:  140 - 180 mg/dL   Lab Results  Component Value Date   GLUCAP 371 (H) 09/14/2016   Diabetes history: Type 2 diabetes Outpatient Diabetes medications: Victoza 1.8 mg daily, Levemir 30 units daily Current orders for Inpatient glycemic control:  Levemir 20 units daily, Novolog sensitive tid with meals  Inpatient Diabetes Program Recommendations:   A1C pending.  Please increasing Levemir to home dose of 30 units daily.  Also may consider adding Novolog meal coverage 5 units tid with meals.  Thanks, Beryl Meager, RN, BC-ADM Inpatient Diabetes Coordinator Pager [phone] (8a-5p)

## 2016-09-14 NOTE — Evaluation (Signed)
Physical Therapy Evaluation Patient Details Name: Alisha Morton MRN: [id number] DOB: [birth year] Today's Date: 09/14/2016   History of Present Illness  50 y/o [place] F with MHx significant for relapsing remitting multiple sclerosis HTN, DM2, HLD who presents for evaluation of sudden onset headache, blurred vision and right-sided numbness.  MRI brain w/o contrast was ordered which showed abnormal signal in right vein of Labbe and enhanced MRV was recommended but was without acute infarction.      Clinical Impression  Pt admitted with above diagnosis. Pt currently with functional limitations due to the deficits listed below (see PT Problem List). Patient describes a feeling of imbalance/about to fall of the edge of something. She reports double vision when focusing on objects >3-514ft away. Reported decr sense of imbalance when educated on visual compensation techniques. Pt will benefit from skilled PT to increase their independence and safety with mobility to allow discharge to the venue listed below.       Follow Up Recommendations Outpatient PT;Supervision for mobility/OOB    Equipment Recommendations  None recommended by PT    Recommendations for Other Services OT consult     Precautions / Restrictions Precautions Precautions: None      Mobility  Bed Mobility Overal bed mobility: Modified Independent             General bed mobility comments: HOB elevated, no rail  Transfers Overall transfer level: Needs assistance Equipment used: Rolling walker (2 wheeled) Transfers: Sit to/from Stand Sit to Stand: Min guard         General transfer comment: closeguard due to dizziness; no unsteadiness  Ambulation/Gait Ambulation/Gait assistance: Min guard Ambulation Distance (Feet): 30 Feet (with RW; 110 no device) Assistive device: 4-wheeled walker;None Gait Pattern/deviations: Step-through pattern;Decreased stride length   Gait velocity interpretation: Below  normal speed for age/gender General Gait Details: no imbalance; reported objects at end of hall looked double, but closer items did not  Careers information officertairs            Wheelchair Mobility    Modified Rankin (Stroke Patients Only)       Balance Overall balance assessment: Needs assistance Sitting-balance support: Feet supported;No upper extremity supported Sitting balance-Leahy Scale: Fair Sitting balance - Comments: >fair TBA   Standing balance support: No upper extremity supported Standing balance-Leahy Scale: Good                               Pertinent Vitals/Pain Pain Assessment: No/denies pain    Home Living Family/patient expects to be discharged to:: Private residence Living Arrangements: Spouse/significant other;Children Available Help at Discharge: Family;Available 24 hours/day Type of Home: House Home Access: Stairs to enter Entrance Stairs-Rails: Doctor, general practiceight;Left Entrance Stairs-Number of Steps: 3 Home Layout: Two level;Bed/bath upstairs Home Equipment: None      Prior Function Level of Independence: Independent               Hand Dominance   Dominant Hand: Right    Extremity/Trunk Assessment   Upper Extremity Assessment: Defer to OT evaluation           Lower Extremity Assessment: Overall WFL for tasks assessed (light touch, proprioception, and coordination intact)      Cervical / Trunk Assessment: Other exceptions  Communication   Communication: No difficulties  Cognition Arousal/Alertness: Awake/alert Behavior During Therapy: Flat affect Overall Cognitive Status: Impaired/Different from baseline Area of Impairment: Memory     Memory: Decreased short-term memory  General Comments: see SLP eval    General Comments General comments (skin integrity, edema, etc.): Patient reports dizziness feels like she's "about to fall off the side of a cloud." Reports intermittent double vision in immediate environment (can focus and  get object to come together to one). Across the room vision she cannot and it remains "a shadow" She reported closing her eyes makes dizziness go away. Educated to focus on a single object nearby and she reported decreased symptoms.    Exercises     Assessment/Plan    PT Assessment Patient needs continued PT services  PT Problem List Decreased balance;Decreased mobility;Decreased knowledge of use of DME;Obesity          PT Treatment Interventions DME instruction;Gait training;Stair training;Functional mobility training;Therapeutic activities;Balance training;Neuromuscular re-education;Patient/family education    PT Goals (Current goals can be found in the Care Plan section)  Acute Rehab PT Goals Patient Stated Goal: go home soon; prefers OPPT PT Goal Formulation: With patient Time For Goal Achievement: 09/21/16 Potential to Achieve Goals: Good    Frequency Min 3X/week   Barriers to discharge        Co-evaluation               End of Session Equipment Utilized During Treatment: Gait belt Activity Tolerance: Patient tolerated treatment well Patient left: in bed;with call bell/phone within reach;with bed alarm set;with family/visitor present Nurse Communication: Mobility status    Functional Assessment Tool Used: clinical judgement Functional Limitation: Mobility: Walking and moving around Mobility: Walking and Moving Around Current Status (J1914): At least 1 percent but less than 20 percent impaired, limited or restricted Mobility: Walking and Moving Around Goal Status [phone]): At least 1 percent but less than 20 percent impaired, limited or restricted    Time: 6213-0865 PT Time Calculation (min) (ACUTE ONLY): 34 min   Charges:   PT Evaluation $PT Eval Low Complexity: 1 Procedure PT Treatments $Gait Training: 8-22 mins   PT G Codes:   PT G-Codes **NOT FOR INPATIENT CLASS** Functional Assessment Tool Used: clinical judgement Functional Limitation: Mobility:  Walking and moving around Mobility: Walking and Moving Around Current Status (H8469): At least 1 percent but less than 20 percent impaired, limited or restricted Mobility: Walking and Moving Around Goal Status [phone]): At least 1 percent but less than 20 percent impaired, limited or restricted    Samani Deal 09/14/2016, 4:58 PM Pager [phone]

## 2016-09-14 NOTE — Evaluation (Signed)
Speech Language Pathology Evaluation Patient Details Name: Alisha Morton MRN: [id number] DOB: Dec 02, 1965 Today's Date: 09/14/2016 Time: [id number] SLP Time Calculation (min) (ACUTE ONLY): 20 min  Problem List:  Patient Active Problem List   Diagnosis Date Noted  . 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 Diabetes mellitus (HCC) 09/13/2016  . Stroke (HCC) 09/13/2016  . Hypertension 09/13/2016  . History of multiple sclerosis 09/13/2016  . Hyperlipidemia 09/13/2016  . Anxiety 09/13/2016  . SVT (supraventricular tachycardia) (HCC) 09/13/2016  . Cerebral venous thrombosis of cortical vein    Past Medical History:  Past Medical History:  Diagnosis Date  . Anxiety   . Diabetes mellitus without complication (HCC)   . Hyperlipidemia   . Hypertension   . Multiple sclerosis (HCC)    Past Surgical History: History reviewed. No pertinent surgical history. HPI:  50 y/o [place] F with MHx significant for relapsing remitting multiple sclerosis HTN, DM2, HLD who presents for evaluation of sudden onset headache, blurred vision and right-sided numbness.  MRI brain w/o contrast was ordered which showed abnormal signal in right vein of Labbe and enhanced MRV was recommended but was without acute infarction.    Assessment / Plan / Recommendation Clinical Impression  Pt reported memory difficulty at baseline. On the [facility] cognitive assessment she scored 16 out of 30 with impairments in working memory, temporal orientation and divergent naming. SLP provided verbal educatin to use writing as a memory tool and use smartphone to set alerts, apps for increased organization/to-do-lists. Pt stated she would like to have outpatient therapy to facilitate memory.     SLP Assessment       Follow Up Recommendations  Outpatient SLP    Frequency and Duration min 2x/week  2 weeks      SLP Evaluation Cognition  Overall Cognitive Status: Impaired/Different from baseline Arousal/Alertness:  Awake/alert Orientation Level: Oriented to person;Oriented to place;Oriented to situation;Disoriented to time Attention: Sustained Sustained Attention: Appears intact Memory: Impaired Memory Impairment: Storage deficit;Retrieval deficit Awareness: Appears intact Problem Solving: Impaired Problem Solving Impairment: Verbal basic Safety/Judgment: Appears intact       Comprehension  Auditory Comprehension Overall Auditory Comprehension: Appears within functional limits for tasks assessed Visual Recognition/Discrimination Discrimination: Not tested Reading Comprehension Reading Status: Not tested (TBA)    Expression Expression Primary Mode of Expression: Verbal Verbal Expression Overall Verbal Expression: Appears within functional limits for tasks assessed Initiation: No impairment Level of Generative/Spontaneous Verbalization: Conversation Repetition: No impairment Naming: No impairment Pragmatics: No impairment Written Expression Dominant Hand: Right Written Expression: Not tested (TBA)   Oral / Motor  Oral Motor/Sensory Function Overall Oral Motor/Sensory Function: Within functional limits Motor Speech Overall Motor Speech: Appears within functional limits for tasks assessed Respiration: Within functional limits Phonation: Normal Resonance: Within functional limits Articulation: Within functional limitis Intelligibility: Intelligible Motor Planning: Witnin functional limits   GO          Functional Assessment Tool Used: skilled clinical judgement Functional Limitations: Memory Memory Current Status ([id number]([id number]): At least 80 percent but less than 100 percent impaired, limited or restricted Memory Goal Status ([id number]([id number]): At least 60 percent but less than 80 percent impaired, limited or restricted         Alisha MacadamiaLitaker, Shedric Fredericks Morton 09/14/2016, 3:34 PM   Alisha Morton

## 2016-09-14 NOTE — Progress Notes (Signed)
  Echocardiogram 2D Echocardiogram has been performed.  Alisha Morton 09/14/2016, 11:18 AM

## 2016-09-14 NOTE — Discharge Instructions (Signed)
You were admitted to the hospital with concern for a stroke.  Thankfully, we did not find any evidence of a stroke or bleeding in your brain and your symptoms got better.  We are not completely sure what caused your headache and made you feel strange; it could have been a TIA, or mini-stroke, or maybe a migraine headache.  Please follow up with Dr Wendi Maya and continue working to get your diabetes and high blood pressure under control.  If you have sudden, terrible headaches; weakness in one arm or one leg; drooping of one side of your face; or have difficulties speaking, please call 911 and go the the ED as these could be signs of a stroke.   Transient Ischemic Attack A transient ischemic attack (TIA) is a "warning stroke" that causes stroke-like symptoms. Unlike a stroke, a TIA does not cause permanent damage to the brain. The symptoms of a TIA can happen very fast and do not last long. It is important to know the symptoms of a TIA and what to do. This can help prevent a major stroke or death. CAUSES  A TIA is caused by a temporary blockage in an artery in the brain or neck (carotid artery). The blockage does not allow the brain to get the blood supply it needs and can cause different symptoms. The blockage can be caused by either:  A blood clot.  Fatty buildup (plaque) in a neck or brain artery. RISK FACTORS  High blood pressure (hypertension).  High cholesterol.  Diabetes mellitus.  Heart disease.  The buildup of plaque in the blood vessels (peripheral artery disease or atherosclerosis).  The buildup of plaque in the blood vessels that provide blood and oxygen to the brain (carotid artery stenosis).  An abnormal heart rhythm (atrial fibrillation).  Obesity.  Using any tobacco products, including cigarettes, chewing tobacco, or electronic cigarettes.  Taking oral contraceptives, especially in combination with using tobacco.  Physical inactivity.  A diet high in fats, salt  (sodium), and calories.  Excessive alcohol use.  Use of illegal drugs (especially cocaine and methamphetamine).  Being female.  Being African American.  Being over the age of 6 years.  Family history of stroke.  Previous history of blood clots, stroke, TIA, or heart attack.  Sickle cell disease. SIGNS AND SYMPTOMS  TIA symptoms are the same as a stroke but are temporary. These symptoms usually develop suddenly, or may be newly present upon waking from sleep:  Sudden weakness or numbness of the face, arm, or leg, especially on one side of the body.  Sudden trouble walking or difficulty moving arms or legs.  Sudden confusion.  Sudden personality changes.  Trouble speaking (aphasia) or understanding.  Difficulty swallowing.  Sudden trouble seeing in one or both eyes.  Double vision.  Dizziness.  Loss of balance or coordination.  Sudden severe headache with no known cause.  Trouble reading or writing.  Loss of bowel or bladder control.  Loss of consciousness. DIAGNOSIS  Your health care provider may be able to determine the presence or absence of a TIA based on your symptoms, history, and physical exam. CT scan of the brain is usually performed to help identify a TIA. Other tests may include:  Electrocardiography (ECG).  Continuous heart monitoring.  Echocardiography.  Carotid ultrasonography.  MRI.  A scan of the brain circulation.  Blood tests. TREATMENT  Since the symptoms of TIA are the same as a stroke, it is important to seek treatment as soon as possible. You  may need a medicine to dissolve a blood clot (thrombolytic) if that is the cause of the TIA. This medicine cannot be given if too much time has passed. Treatment may also include:   Rest, oxygen, fluids through an IV tube, and medicines to thin the blood (anticoagulants).  Measures will be taken to prevent short-term and long-term complications, including infection from breathing foreign  material into the lungs (aspiration pneumonia), blood clots in the legs, and falls.  Procedures to either remove plaque in the carotid arteries or dilate carotid arteries that have narrowed due to plaque. Those procedures are:  Carotid endarterectomy.  Carotid angioplasty and stenting.  Medicines and diet may be used to address diabetes, high blood pressure, and other underlying risk factors. HOME CARE INSTRUCTIONS   Take medicines only as directed by your health care provider. Follow the directions carefully. Medicines may be used to control risk factors for a stroke. Be sure you understand all your medicine instructions.  You may be told to take aspirin or the anticoagulant warfarin. Warfarin needs to be taken exactly as instructed.  Taking too much or too little warfarin is dangerous. Too much warfarin increases the risk of bleeding. Too little warfarin continues to allow the risk for blood clots. While taking warfarin, you will need to have regular blood tests to measure your blood clotting time. A PT blood test measures how long it takes for blood to clot. Your PT is used to calculate another value called an INR. Your PT and INR help your health care provider to adjust your dose of warfarin. The dose can change for many reasons. It is critically important that you take warfarin exactly as prescribed.  Many foods, especially foods high in vitamin K can interfere with warfarin and affect the PT and INR. Foods high in vitamin K include spinach, kale, broccoli, cabbage, collard and turnip greens, Brussels sprouts, peas, cauliflower, seaweed, and parsley, as well as beef and pork liver, green tea, and soybean oil. You should eat a consistent amount of foods high in vitamin K. Avoid major changes in your diet, or notify your health care provider before changing your diet. Arrange a visit with a dietitian to answer your questions.  Many medicines can interfere with warfarin and affect the PT and  INR. You must tell your health care provider about any and all medicines you take; this includes all vitamins and supplements. Be especially cautious with aspirin and anti-inflammatory medicines. Do not take or discontinue any prescribed or over-the-counter medicine except on the advice of your health care provider or pharmacist.  Warfarin can have side effects, such as excessive bruising or bleeding. You will need to hold pressure over cuts for longer than usual. Your health care provider or pharmacist will discuss other potential side effects.  Avoid sports or activities that may cause injury or bleeding.  Be careful when shaving, flossing your teeth, or handling sharp objects.  Alcohol can change the body's ability to handle warfarin. It is best to avoid alcoholic drinks or consume only very small amounts while taking warfarin. Notify your health care provider if you change your alcohol intake.  Notify your dentist or other health care providers before procedures.  Eat a diet that includes 5 or more servings of fruits and vegetables each day. This may reduce the risk of stroke. Certain diets may be prescribed to address high blood pressure, high cholesterol, diabetes, or obesity.  A diet low in sodium, saturated fat, trans fat, and cholesterol is  recommended to manage high blood pressure.  A diet low in saturated fat, trans fat, and cholesterol, and high in fiber may control cholesterol levels.  A controlled-carbohydrate, controlled-sugar diet is recommended to manage diabetes.  A reduced-calorie diet that is low in sodium, saturated fat, trans fat, and cholesterol is recommended to manage obesity.  Maintain a healthy weight.  Stay physically active. It is recommended that you get at least 30 minutes of activity on most or all days.  Do not use any tobacco products, including cigarettes, chewing tobacco, or electronic cigarettes. If you need help quitting, ask your health care  provider.  Limit alcohol intake to no more than 1 drink per day for nonpregnant women and 2 drinks per day for men. One drink equals 12 ounces of beer, 5 ounces of wine, or 1 ounces of hard liquor.  Do not abuse drugs.  A safe home environment is important to reduce the risk of falls. Your health care provider may arrange for specialists to evaluate your home. Having grab bars in the bedroom and bathroom is often important. Your health care provider may arrange for equipment to be used at home, such as raised toilets and a seat for the shower.  Follow all instructions for follow-up with your health care provider. This is very important. This includes any referrals and lab tests. Proper follow-up can prevent a stroke or another TIA from occurring. PREVENTION  The risk of a TIA can be decreased by appropriately treating high blood pressure, high cholesterol, diabetes, heart disease, and obesity, and by quitting smoking, limiting alcohol, and staying physically active. SEEK MEDICAL CARE IF:  You have personality changes.  You have difficulty swallowing.  You are seeing double.  You have dizziness.  You have a fever. SEEK IMMEDIATE MEDICAL CARE IF:  Any of the following symptoms may represent a serious problem that is an emergency. Do not wait to see if the symptoms will go away. Get medical help right away. Call your local emergency services (911 in [place] Do not drive yourself to the hospital.  You have sudden weakness or numbness of the face, arm, or leg, especially on one side of the body.  You have sudden trouble walking or difficulty moving arms or legs.  You have sudden confusion.  You have trouble speaking (aphasia) or understanding.  You have sudden trouble seeing in one or both eyes.  You have a loss of balance or coordination.  You have a sudden, severe headache with no known cause.  You have new chest pain or an irregular heartbeat.  You have a partial or total loss  of consciousness. MAKE SURE YOU:   Understand these instructions.  Will watch your condition.  Will get help right away if you are not doing well or get worse.   This information is not intended to replace advice given to you by your health care provider. Make sure you discuss any questions you have with your health care provider.   Document Released: 08/05/2005 Document Revised: 11/16/2014 Document Reviewed: 01/31/2014 Elsevier Interactive Patient Education [employer]2016 Elsevier Inc.

## 2016-09-14 NOTE — Progress Notes (Addendum)
Subjective: She feels tired, slightly confused, but much better than yesterday.  No weakness or numbness, headache greatly improved.  Objective:  Vital signs in last 24 hours: Vitals:   09/14/16 0147 09/14/16 0330 09/14/16 0530 09/14/16 0730  BP: (!) 176/87 137/71 (!) 143/76 (!) 151/86  Pulse: 87 (!) 107 (!) 103 94  Resp: 18 18 18 18   Temp: 98.5 F (36.9 C) 98.4 F (36.9 C) 98.4 F (36.9 C) 97.8 F (36.6 C)  TempSrc: Oral Oral Oral Oral  SpO2: 96% 95% 96% 95%  Weight: 236 lb 1.6 oz (107.1 kg)     Height: 5\' 8"  (1.727 m)      Physical Exam  Constitutional: She is oriented to person, place, and time. She appears well-developed and well-nourished.  Eyes: EOM are normal. Pupils are equal, round, and reactive to light.  Cardiovascular: Normal rate and regular rhythm.   Pulmonary/Chest: Effort normal and breath sounds normal.  Neurological: She is alert and oriented to person, place, and time. No cranial nerve deficit or sensory deficit.  Strength intact and symmetric throughout bilateral UE and LE  Psychiatric: She has a normal mood and affect. Her behavior is normal.     CBC Latest Ref Rng & Units 09/14/2016 09/13/2016 09/13/2016  WBC 4.0 - 10.5 K/uL 11.8(H) - 11.6(H)  Hemoglobin 12.0 - 15.0 g/dL 56.2 13.0 86.5  Hematocrit 36.0 - 46.0 % 37.2 41.0 39.8  Platelets 150 - 400 K/uL 214 - 211   CMP Latest Ref Rng & Units 09/14/2016 09/13/2016 09/13/2016  Glucose 65 - 99 mg/dL 784(O) 962(X) 528(U)  BUN 6 - 20 mg/dL 6 5(L) 5(L)  Creatinine 0.44 - 1.00 mg/dL 1.32 4.40 1.02  Sodium 135 - 145 mmol/L 137 140 139  Potassium 3.5 - 5.1 mmol/L 3.9 3.5 3.5  Chloride 101 - 111 mmol/L 103 101 103  CO2 22 - 32 mmol/L 25 - 25  Calcium 8.9 - 10.3 mg/dL 9.5 - 9.8  Total Protein 6.5 - 8.1 g/dL 6.3(L) - 6.5  Total Bilirubin 0.3 - 1.2 mg/dL 0.8 - 0.8  Alkaline Phos 38 - 126 U/L 133(H) - 149(H)  AST 15 - 41 U/L 18 - 17  ALT 14 - 54 U/L 18 - 20    Lipid Panel     Component Value Date/Time   CHOL 241 (H) 09/14/2016 0447   TRIG 227 (H) 09/14/2016 0447   HDL 40 (L) 09/14/2016 0447   CHOLHDL 6.0 09/14/2016 0447   VLDL 45 (H) 09/14/2016 0447   LDLCALC 156 (H) 09/14/2016 0447   No results found for: HGBA1C  CT Head 09/13/16 IMPRESSION: 1. No acute finding.ASPECTS is 10. 2. Nonspecific white matter disease as described above.  MRI Brain 09/13/16 IMPRESSION: 1. Abnormal signal in the right vein of Labbe, recommend enhanced MRV or CTV. 2. No acute infarct. 3. Moderate white matter disease with nonspecific pattern in this patient with history of multiple sclerosis and vascular risk Factors.  MR Venogram Head and Neck 09/13/16 IMPRESSION: 1. Normal MRV of the brain. Specifically, there is normal enhancement of the right vein of Labbe. 2. Normal MRA of the neck.   Assessment/Plan:  Active Problems:   Diabetes mellitus (HCC)   Stroke (HCC)   Hypertension   History of multiple sclerosis   Hyperlipidemia   Anxiety   Stroke (cerebrum) (HCC)  #Stroke Ruleout Symptoms of hyperacute onset headache, visual disturbance, lightheadedness, and right sided numbness.  No infarct on MRI.  This event may represent migraine, TIA, or Uhthoff's  phenomenon in patient with known MS.   Her headache resolved after migraine cocktail of NSAID, antiemetic, and antihistamine and she has no neurologic deficits. She has multiple risk factors for CVA, including DM, HTN, HL, and family history.  Will further investigate risk factors and optimize medical management.  ASCVD elevated at 22.6%, high intensity statin indicated. -Echo -A1c -CTA head and neck -Continue high intensity statin with atorvastatin 80 mg daily  #HTN Permissive HTN <220/120 for 1 week.  On metoprolol and lasix as home meds which have been held.  Hypertensive to 170s/90s. -Hold home meds -Start 5 mg lisinopril for renoprotection in diabetes and titration for HTN as outpatient  #DM2 On Levemir 30U QHS and Victoza 1.8 mg  daily -Levemir 20U QHS -SSI -Hold home liraglutide  Dispo: Anticipated discharge in approximately 1 day(s).   Alm BustardMatthew O'Sullivan, MD 09/14/2016, 9:25 AM Pager: [phone]

## 2016-09-14 NOTE — Care Management Note (Signed)
Case Management Note  Patient Details  Name: Alisha Morton MRN: [id number] Date of Birth: [birth year]  Subjective/Objective:  Patient admitted with dx of  stroke               Action/Plan: Patient discharged to home with home health services.  PT also recommending out patient PT.  Discussed with patient who reports she would like to go to out patient rehab in [place].  Patient reports she would like a 3 in 1 commode and a rolling walker and shower bench. [facility] informed patient she may get shower bench from local pharmacy.   Patient agreeable to [facility] for dme.  [facility] informed patient that she will need to pick equipment up at the [facility] store.  Patient reports she has family who can do that.  [facility] provided patient with phone number to [facility].  Patient reports she has a lot of support at home and is doing quite well.  She reports she has a bathroom in her bedroom.  She reports until she is feeling better she will stay in her room at home.  Patient reports she feels very safe returning home.  [facility] discussed patient with Dr. Vincente Liberty who will sign rehab form.  EDCM will fax to [facility] out patient rehab in [place].  Discussed patient with unit RN.  [facility] faxed home health orders to [facility] and dme orders to [facility] with confirmation of receipt.  No further EDCM needs at this time.   Expected Discharge Date:                  Expected Discharge Plan:  Home w Home Health Services  In-House Referral:     Discharge planning Services  CM Consult  Post Acute Care Choice:  Durable Medical Equipment, Home Health Choice offered to:  Patient  DME Arranged:  3-N-1, Walker rolling DME Agency:  [facility]  HH Arranged:  PT, OT, Speech Therapy HH Agency:  [facility] (now [facility])  Status of Service:  Completed, signed off  If discussed at Long Length of Stay Meetings, dates discussed:    Additional CommentsRadford Pax, RN 09/14/2016, 10:33 PM

## 2016-09-14 NOTE — H&P (Signed)
  Date: 09/14/2016  Patient name: Alisha Morton  Medical record number: [id number]  Date of birth: [birth year]   I have seen and evaluated Alisha Morton and discussed their care with the Residency Team.  50 yo F with hx of DM, relapsing remitting Multiple sclerosis (followed at [facility], off therapy, last episode June per pt), with acute onset of headache, R sided numbness, and vision change. She was driving at the time and had to pull off and get assistance. She was brought to ED via EMS and her SBP in EMS was 210.  Her Dm hx is notable for missed medications and not checking her FSG yesterday due to visiting a friend.  In ED her BP had improved to 179/91. She had MRI of head showing abnormal signal in r vein of Labbe and non-specific white matter disease. This was not confirmed on MRI/A.  Her headache resolved promptly with "migraine cocktail".   PMHx, Fam Hx, and/or Soc Hx : [facility] and [facility].   Vitals:   09/14/16 0730 09/14/16 0950  BP: (!) 151/86 (!) 147/81  Pulse: 94 86  Resp: 18 19  Temp: 97.8 F (36.6 C) 98.4 F (36.9 C)  Eyes- EOMI, PERRL Mouth- without lesion Chest- CTA CV- RRR Abd- BS+, soft, non-tender Extr- no edema Neuro- no focal abn.     Assessment and Plan: I have seen and evaluated the patient as outlined above. I agree with the formulated Assessment and Plan as detailed in the residents' note, with the following changes:   1. TIA Her w/u so far has been negative ( MRI/A, MRI neck, CT head).   Will ask neuro to continue to follow The differential is quite broad- TIA (she has multiple risk factors). MS. Diabetes, irregularities in her FSG. Atypical Migraine.   HTN Her BP is improved.   DM2 Will work to improve her DM control.   Ginnie Smart, MD 11/6/201710:15 AM
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
## 2016-09-14 NOTE — Progress Notes (Signed)
Pt received Discharge order.  Discharge summary discussed for pt to have Outpatient PT/OT/ST. Case Manager was notified and case manager contacted pt in room directly. Pt transported in wheelchair with family at bedside. All belongings with pt.  D/C IV lines.
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
## 2016-09-14 NOTE — Progress Notes (Signed)
Pt arrived from ED around 0130 alert and oriented having severe head ache CT and MRI were negative for stroke, blood sugar is high. Obtained a migraine cocktail from IM attending which cleared up her head ache, will continue to monitor.

## 2016-09-14 NOTE — ED Notes (Signed)
Paged Roseanne Reno to [phone]

## 2016-09-15 LAB — HEMOGLOBIN A1C
Hgb A1c MFr Bld: 13.7 % — ABNORMAL HIGH (ref 4.8–5.6)
Mean Plasma Glucose: 346 mg/dL

## 2016-09-15 NOTE — Progress Notes (Addendum)
09/15/2016 A. Quinette Hentges RNCM 1626 pm.  [facility] received phone call from [place] of [facility] requesting recent face to face and discharge summary.  EDCM called Kendal Hymen back at [phone] without success, phone kept ringing not able to leave a voice mail.  [facility] faxed items to fax number provided by Kendal Hymen [phone] without success.  EDCM called and spoke to Tim transitional care specialist for [facility] who reports he has all the orders and information needed for services.    [facility] received phone call from Aggie Cosier of outpatient rehab services in [place] requesting name of the physician who signed out patient PT order.  EDCM called Aggie Cosier back at phone number [phone] and provided name of physician Dr. Toma Copier Molt.  No further EDCM needs at this time.

## 2016-09-21 ENCOUNTER — Ambulatory Visit: Payer: Medicare HMO | Admitting: Physical Therapy

## 2017-03-01 IMAGING — CT CT ANGIO HEAD
1 of 11 series · 1 of 33 positions shown · IV contrast (isovue)
Comparison: CT head without contrast 09/13/2016. MRI the brain, MRA
of the neck, and MR venogram of the head 09/13/2016.

CLINICAL DATA: Acute onset of headache and right-sided numbness
with visual change. Personal history of relapsing and remitting
multiple sclerosis.

EXAM:
CT ANGIOGRAPHY HEAD
TECHNIQUE: Multidetector CT imaging of the head was performed using the
standard protocol during bolus administration of intravenous
contrast. Multiplanar CT image reconstructions and MIPs were
obtained to evaluate the vascular anatomy.
CONTRAST:  100 mL Isovue 370

[Series 300: locator · axial · 0.49mm/px · 1 of 1 slices shown]
[im 1/1  soft-tissue]
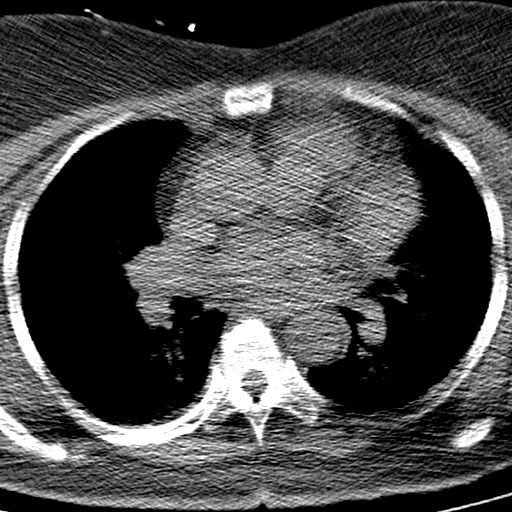

[1 of 33 positions shown; findings below may reference images not displayed]

FINDINGS: CT HEAD

Brain: Noncontrast images demonstrate scattered periventricular and
subcortical white matter hypoattenuation corresponding to the T2
signal changes evident on MRI. No acute or focal cortical
abnormality is present. There is no hemorrhage or mass lesion. The
ventricles are of normal size. No significant extra-axial fluid
collection is present.

Vascular: No significant vascular calcifications are present.

Skull: The calvarium is intact.

Sinuses: The paranasal sinuses and the mastoid air cells are clear.

Orbits: The globes orbits are intact.

CTA HEAD

Anterior circulation: The internal carotid arteries are within
normal limits from the high cervical segments through the ICA
termini bilaterally. The A1 and M1 segments are within normal
limits. The left A1 segment is dominant. The anterior communicating
artery is patent. The MCA bifurcations are intact bilaterally. The
ACA and MCA branch vessels are unremarkable.

Posterior circulation: The left vertebral artery is dominant. The
PICA origins are visualized and normal bilaterally. The
vertebrobasilar junction is intact. The basilar artery is normal.
Both posterior cerebral arteries originate from the basilar tip. PCA
branch vessels are normal bilaterally.

Venous sinuses: The dural sinuses are patent. Cortical veins
including the vein lobe day are patent bilaterally. The straight
sinus and deep cerebral veins are intact.

Anatomic variants: None

Delayed phase: The delayed images confirm patency of the dural
sinuses. No pathologic enhancement is present.
IMPRESSION: 1. Normal CT appearance circle of Willis.
2. Moderate white matter disease compatible with given history of
multiple sclerosis and unchanged from the MRI scan.
3. The dural sinuses and cortical veins are within normal limits.
